# Patient Record
Sex: Male | Born: 1960 | Race: White | Hispanic: No | Smoking: Current every day smoker
Health system: Southern US, Community
[De-identification: ages and names within clinical notes are randomized; demographics above are authoritative.]

## PROBLEM LIST (undated history)

## (undated) DIAGNOSIS — N200 Calculus of kidney: Secondary | ICD-10-CM

## (undated) DIAGNOSIS — F1910 Other psychoactive substance abuse, uncomplicated: Secondary | ICD-10-CM

## (undated) HISTORY — PX: NECK SURGERY: SHX720

## (undated) HISTORY — PX: HERNIA REPAIR: SHX51

---

## 2005-12-01 ENCOUNTER — Other Ambulatory Visit: Payer: Self-pay

## 2005-12-01 ENCOUNTER — Emergency Department: Payer: Self-pay | Admitting: Emergency Medicine

## 2005-12-29 ENCOUNTER — Other Ambulatory Visit: Payer: Self-pay

## 2005-12-29 ENCOUNTER — Emergency Department: Payer: Self-pay | Admitting: Internal Medicine

## 2006-01-03 ENCOUNTER — Ambulatory Visit: Payer: Self-pay | Admitting: Internal Medicine

## 2006-03-09 ENCOUNTER — Emergency Department: Payer: Self-pay | Admitting: Emergency Medicine

## 2006-04-05 ENCOUNTER — Emergency Department: Payer: Self-pay | Admitting: Unknown Physician Specialty

## 2006-04-06 ENCOUNTER — Emergency Department: Payer: Self-pay | Admitting: Emergency Medicine

## 2006-04-16 ENCOUNTER — Emergency Department: Payer: Self-pay | Admitting: Emergency Medicine

## 2006-04-18 ENCOUNTER — Emergency Department: Payer: Self-pay | Admitting: Emergency Medicine

## 2006-04-24 ENCOUNTER — Emergency Department: Payer: Self-pay | Admitting: Emergency Medicine

## 2006-04-25 ENCOUNTER — Ambulatory Visit: Payer: Self-pay | Admitting: Urology

## 2006-04-30 ENCOUNTER — Inpatient Hospital Stay: Payer: Self-pay | Admitting: Urology

## 2006-05-29 ENCOUNTER — Emergency Department: Payer: Self-pay | Admitting: Emergency Medicine

## 2007-06-08 ENCOUNTER — Emergency Department: Payer: Self-pay | Admitting: Emergency Medicine

## 2007-11-05 ENCOUNTER — Ambulatory Visit: Payer: Self-pay | Admitting: Internal Medicine

## 2007-12-21 ENCOUNTER — Ambulatory Visit: Payer: Self-pay | Admitting: Family Medicine

## 2007-12-21 ENCOUNTER — Emergency Department: Payer: Self-pay | Admitting: Emergency Medicine

## 2009-08-02 IMAGING — CT CT STONE STUDY
1 of 2 series · 16 of 32 positions shown, 20 images · non-contrast
Comparison: none

REASON FOR EXAM: flank pain
COMMENTS:

[Series 2: stone · axial · 0.63mm/px · z∈[-628,-238]mm · 16 of 147 slices shown, 20 images]
[im 11/147  soft-tissue]
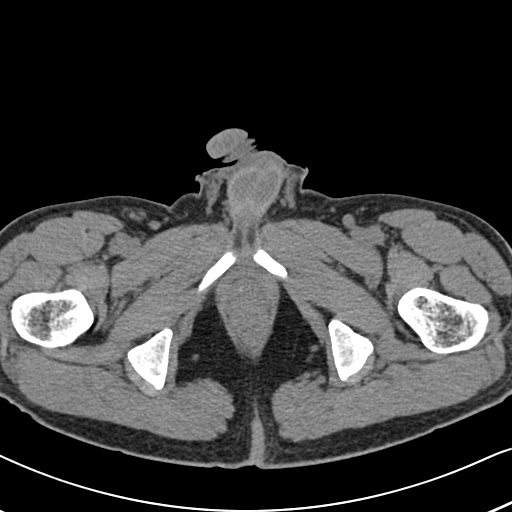
[im 11/147  bone]
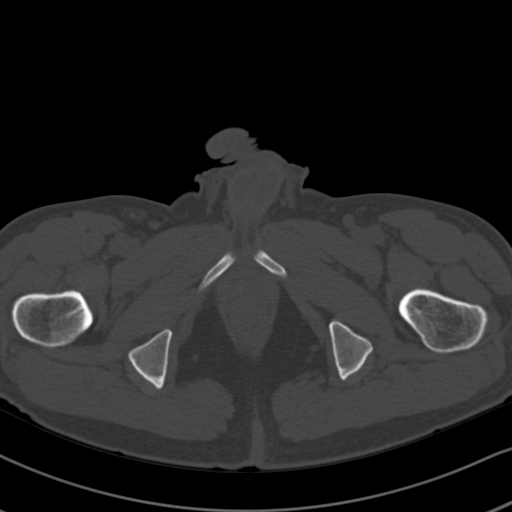
[im 21/147  soft-tissue]
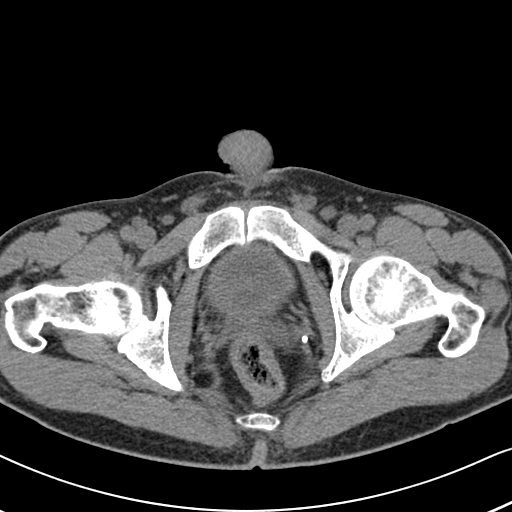
[im 31/147  soft-tissue]
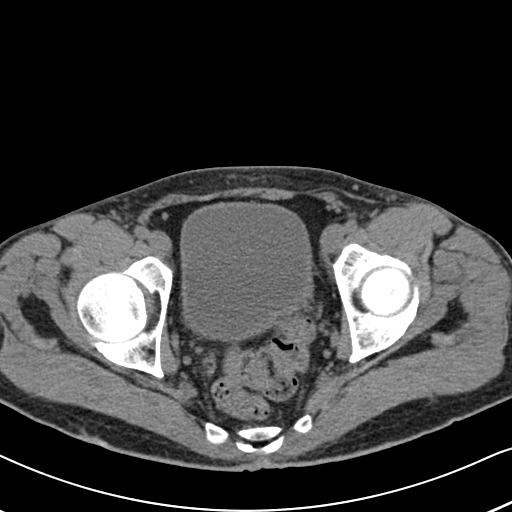
[im 41/147  soft-tissue]
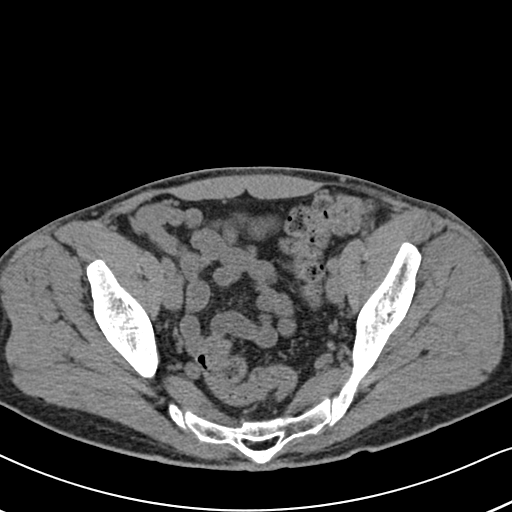
[im 51/147  soft-tissue]
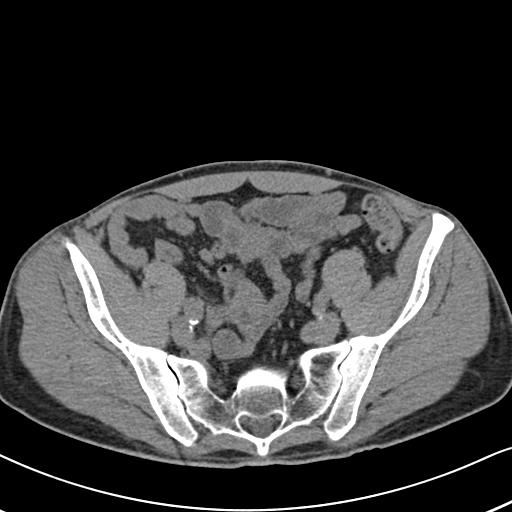
[im 61/147  soft-tissue]
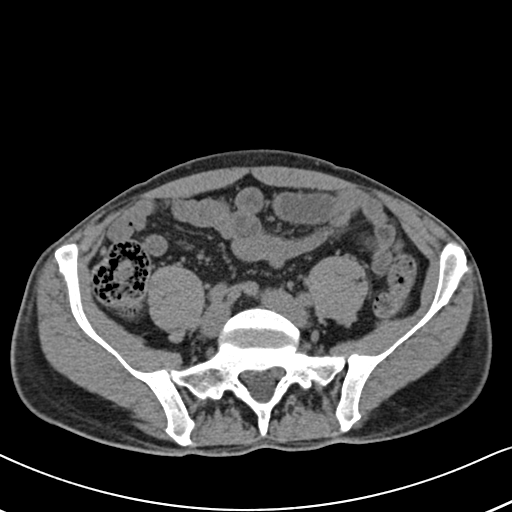
[im 71/147  soft-tissue]
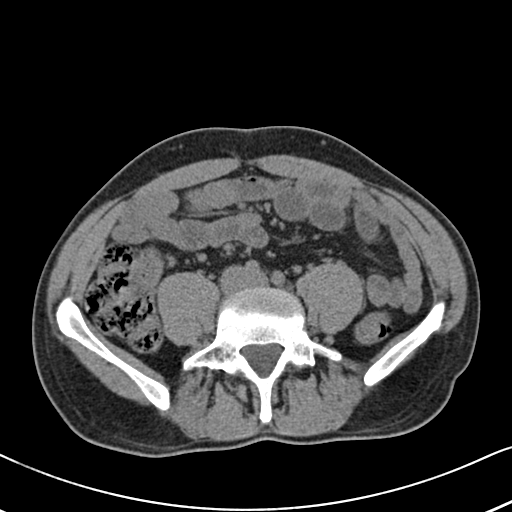
[im 81/147  soft-tissue]
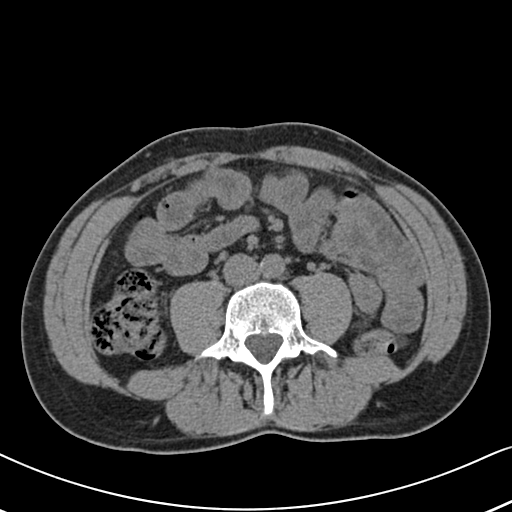
[im 91/147  soft-tissue]
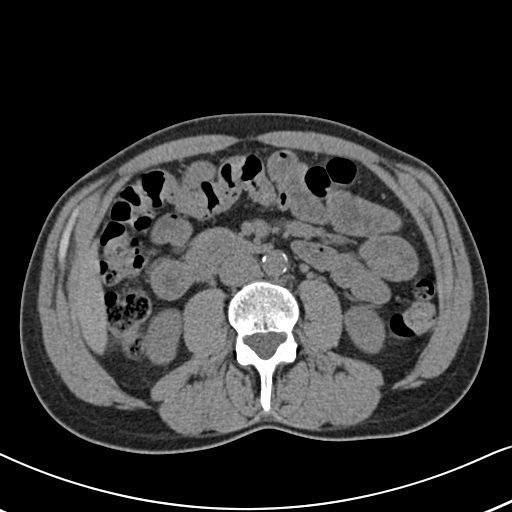
[im 91/147  bone]
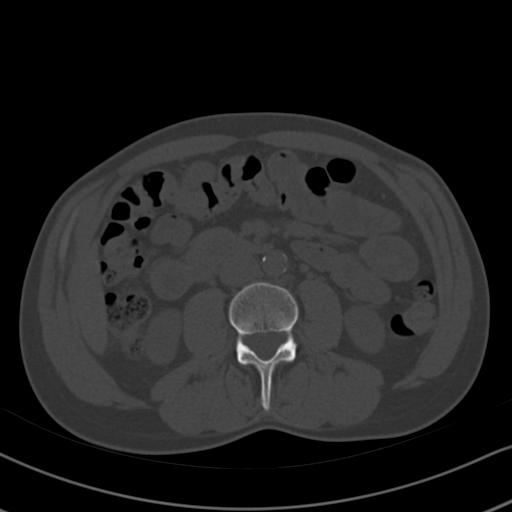
[im 101/147  soft-tissue]
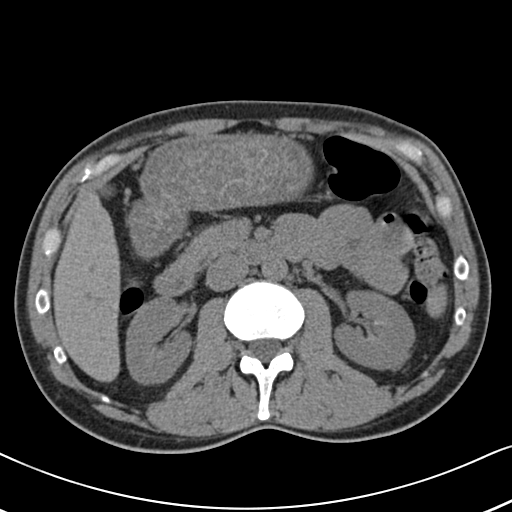
[im 111/147  soft-tissue]
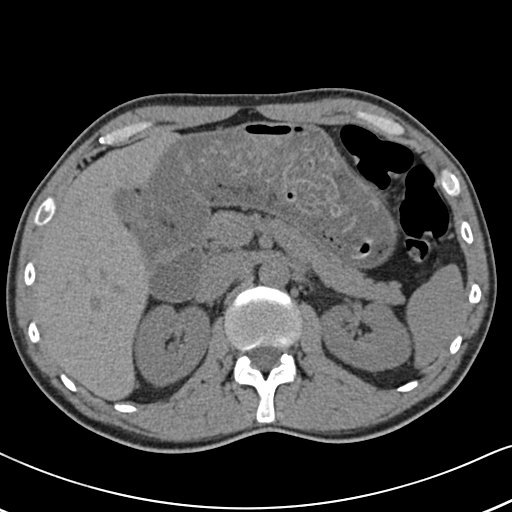
[im 121/147  soft-tissue]
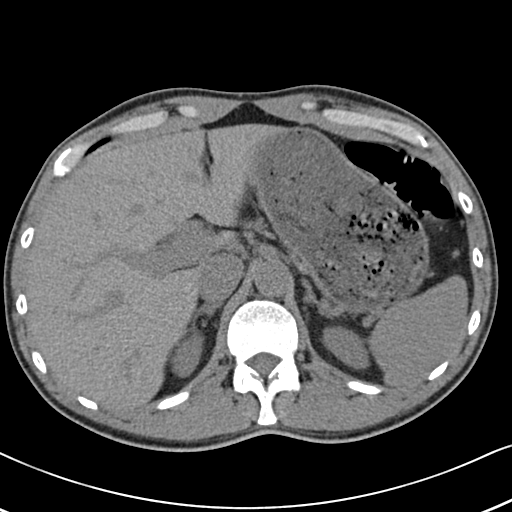
[im 126/147  lung]
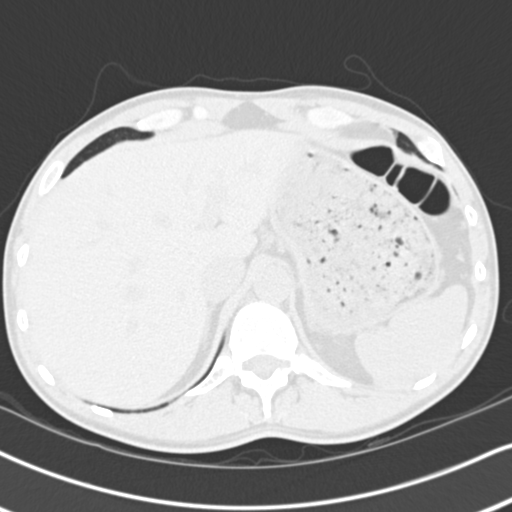
[im 131/147  soft-tissue]
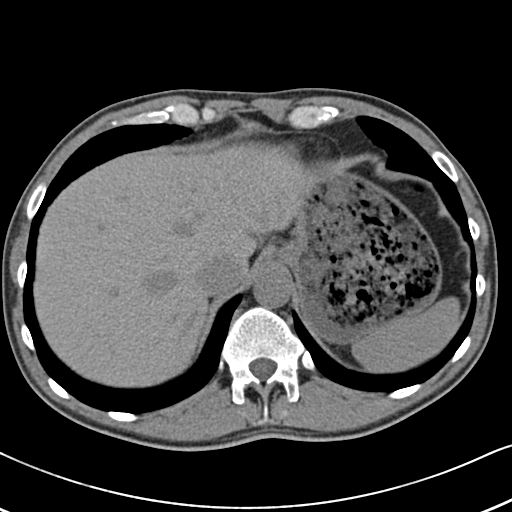
[im 131/147  lung]
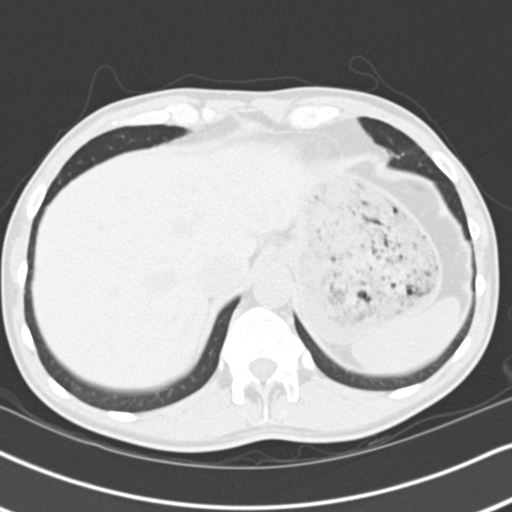
[im 136/147  lung]
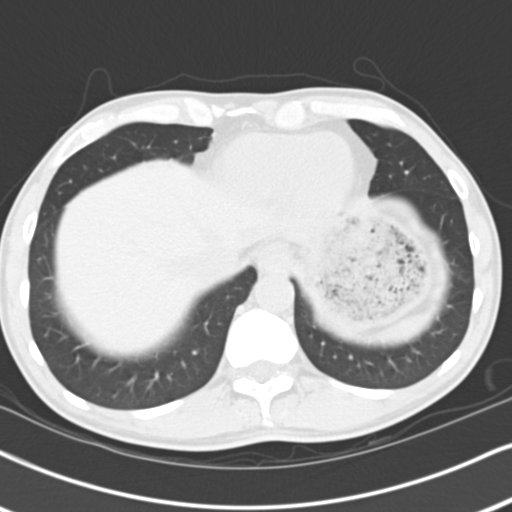
[im 141/147  soft-tissue]
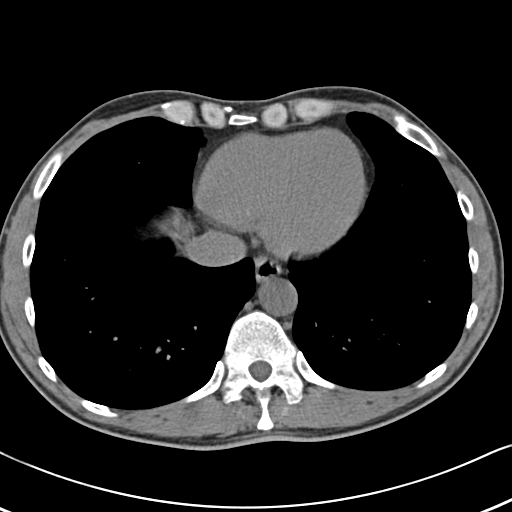
[im 141/147  lung]
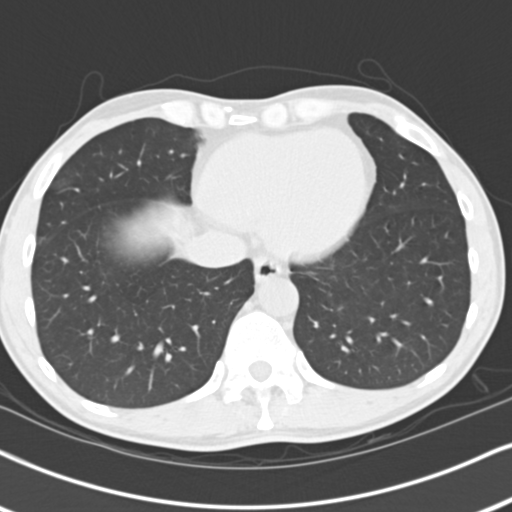

[16 of 32 positions shown; findings below may reference images not displayed]

PROCEDURE:     CT  - CT ABDOMEN /PELVIS WO (STONE)  - December 21, 2007  [DATE]

RESULT:     Noncontrast emergent CT of the abdomen and pelvis is performed.
The patient's previous exams cannot be viewed at this time because of a
registration merge issue.

As noted on previous images, there are nonobstructing bilateral renal
calculi. The stomach is moderately distended and filled with fluid and solid
material. No radiopaque gallstones are evident. The aorta is normal in
caliber. There is some scattered atherosclerotic calcification. The
remaining portion of the bowel appears unremarkable. The appendix is normal.
There is no free fluid or abnormal fluid collection. The urinary bladder
contains no stones and appears unremarkable. The lung bases appear clear.
IMPRESSION: Bilateral renal calculi. No obstructive change evident. No
ureteral stones can be definitely identified.

## 2019-08-31 ENCOUNTER — Encounter: Payer: Self-pay | Admitting: Emergency Medicine

## 2019-08-31 ENCOUNTER — Emergency Department
Admission: EM | Admit: 2019-08-31 | Discharge: 2019-08-31 | Disposition: A | Discharge status: Home / Self Care | Payer: Self-pay | Attending: Emergency Medicine | Admitting: Emergency Medicine

## 2019-08-31 ENCOUNTER — Other Ambulatory Visit: Payer: Self-pay

## 2019-08-31 DIAGNOSIS — Z5320 Procedure and treatment not carried out because of patient's decision for unspecified reasons: Secondary | ICD-10-CM | POA: Insufficient documentation

## 2019-08-31 DIAGNOSIS — Y92410 Unspecified street and highway as the place of occurrence of the external cause: Secondary | ICD-10-CM | POA: Insufficient documentation

## 2019-08-31 DIAGNOSIS — Y9389 Activity, other specified: Secondary | ICD-10-CM | POA: Insufficient documentation

## 2019-08-31 DIAGNOSIS — Y999 Unspecified external cause status: Secondary | ICD-10-CM | POA: Insufficient documentation

## 2019-08-31 DIAGNOSIS — J3489 Other specified disorders of nose and nasal sinuses: Secondary | ICD-10-CM | POA: Insufficient documentation

## 2019-08-31 NOTE — ED Notes (Signed)
Patient called, no answer.
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
## 2019-08-31 NOTE — ED Notes (Signed)
Called patient to speak to [employer] officer - no response. Patient not seen in lobby, bathroom, or outside.

## 2019-08-31 NOTE — ED Notes (Signed)
Called no answer. Not seen in lobby, bathroom, or outside.  

## 2019-10-11 ENCOUNTER — Emergency Department
Admission: EM | Admit: 2019-10-11 | Discharge: 2019-10-11 | Disposition: A | Discharge status: Home / Self Care | Payer: No Typology Code available for payment source | Attending: Emergency Medicine | Admitting: Emergency Medicine

## 2019-10-11 ENCOUNTER — Emergency Department: Payer: No Typology Code available for payment source

## 2019-10-11 ENCOUNTER — Other Ambulatory Visit: Payer: Self-pay

## 2019-10-11 ENCOUNTER — Encounter: Payer: Self-pay | Admitting: Emergency Medicine

## 2019-10-11 DIAGNOSIS — R197 Diarrhea, unspecified: Secondary | ICD-10-CM | POA: Diagnosis not present

## 2019-10-11 DIAGNOSIS — Z79899 Other long term (current) drug therapy: Secondary | ICD-10-CM | POA: Diagnosis not present

## 2019-10-11 DIAGNOSIS — R1031 Right lower quadrant pain: Secondary | ICD-10-CM | POA: Insufficient documentation

## 2019-10-11 DIAGNOSIS — F17210 Nicotine dependence, cigarettes, uncomplicated: Secondary | ICD-10-CM | POA: Diagnosis not present

## 2019-10-11 DIAGNOSIS — R112 Nausea with vomiting, unspecified: Secondary | ICD-10-CM | POA: Diagnosis not present

## 2019-10-11 DIAGNOSIS — Z20822 Contact with and (suspected) exposure to covid-19: Secondary | ICD-10-CM | POA: Diagnosis not present

## 2019-10-11 HISTORY — DX: Other psychoactive substance abuse, uncomplicated: F19.10

## 2019-10-11 HISTORY — DX: Calculus of kidney: N20.0

## 2019-10-11 LAB — CBC
HCT: 58 % — ABNORMAL HIGH (ref 39.0–52.0)
Hemoglobin: 19.5 g/dL — ABNORMAL HIGH (ref 13.0–17.0)
MCH: 28.4 pg (ref 26.0–34.0)
MCHC: 33.6 g/dL (ref 30.0–36.0)
MCV: 84.5 fL (ref 80.0–100.0)
Platelets: 309 10*3/uL (ref 150–400)
RBC: 6.86 MIL/uL — ABNORMAL HIGH (ref 4.22–5.81)
RDW: 13.6 % (ref 11.5–15.5)
WBC: 15.3 10*3/uL — ABNORMAL HIGH (ref 4.0–10.5)
nRBC: 0 % (ref 0.0–0.2)

## 2019-10-11 LAB — HEPATIC FUNCTION PANEL
ALT: 16 U/L (ref 0–44)
AST: 23 U/L (ref 15–41)
Albumin: 4.4 g/dL (ref 3.5–5.0)
Alkaline Phosphatase: 60 U/L (ref 38–126)
Bilirubin, Direct: 0.1 mg/dL (ref 0.0–0.2)
Indirect Bilirubin: 0.9 mg/dL (ref 0.3–0.9)
Total Bilirubin: 1 mg/dL (ref 0.3–1.2)
Total Protein: 7.5 g/dL (ref 6.5–8.1)

## 2019-10-11 LAB — BASIC METABOLIC PANEL
Anion gap: 13 (ref 5–15)
BUN: 19 mg/dL (ref 6–20)
CO2: 23 mmol/L (ref 22–32)
Calcium: 9 mg/dL (ref 8.9–10.3)
Chloride: 101 mmol/L (ref 98–111)
Creatinine, Ser: 1 mg/dL (ref 0.61–1.24)
GFR calc Af Amer: >60 mL/min (ref >60)
GFR calc non Af Amer: >60 mL/min (ref >60)
Glucose, Bld: 97 mg/dL (ref 70–99)
Potassium: 4 mmol/L (ref 3.5–5.1)
Sodium: 137 mmol/L (ref 135–145)

## 2019-10-11 LAB — RESPIRATORY PANEL BY RT PCR (FLU A&B, COVID)
Influenza A by PCR: NEGATIVE
Influenza B by PCR: NEGATIVE
SARS Coronavirus 2 by RT PCR: NEGATIVE

## 2019-10-11 LAB — LIPASE, BLOOD: Lipase: 21 U/L (ref 11–51)

## 2019-10-11 LAB — TROPONIN I (HIGH SENSITIVITY): Troponin I (High Sensitivity): 3 ng/L (ref <18)

## 2019-10-11 LAB — GLUCOSE, CAPILLARY: Glucose-Capillary: 108 mg/dL — ABNORMAL HIGH (ref 70–99)

## 2019-10-11 MED ORDER — SODIUM CHLORIDE 0.9 % IV BOLUS
1000.0000 mL | Freq: Once | INTRAVENOUS | Status: AC
  Administered 2019-10-11: 1000 mL via INTRAVENOUS

## 2019-10-11 MED ORDER — CIPROFLOXACIN HCL 500 MG PO TABS: 500.0000 mg | ORAL_TABLET | Freq: Two times a day (BID) | ORAL | 0 refills | Status: AC

## 2019-10-11 MED ORDER — METRONIDAZOLE 500 MG PO TABS: 500.0000 mg | ORAL_TABLET | Freq: Two times a day (BID) | ORAL | 0 refills | Status: DC

## 2019-10-11 MED ORDER — ONDANSETRON HCL 4 MG/2ML IJ SOLN
4.0000 mg | Freq: Once | INTRAMUSCULAR | Status: AC
  Administered 2019-10-11: 4 mg via INTRAVENOUS
  Filled 2019-10-11: qty 2

## 2019-10-11 MED ORDER — ONDANSETRON 4 MG PO TBDP: 4.0000 mg | ORAL_TABLET | Freq: Three times a day (TID) | ORAL | 0 refills | Status: DC | PRN

## 2019-10-11 MED ORDER — IOHEXOL 300 MG/ML  SOLN
100.0000 mL | Freq: Once | INTRAMUSCULAR | Status: AC | PRN
  Administered 2019-10-11: 100 mL via INTRAVENOUS

## 2019-10-11 NOTE — ED Provider Notes (Signed)
[facility] Emergency Department Provider Note ____________________________________________   First MD Initiated Contact with Patient 10/11/19 1239     (approximate)  I have reviewed the triage vital signs and the nursing notes.   HISTORY  Chief Complaint Abdominal Pain  HPI Jerry [name] is a 59 y.o. male presents to the emergency department for treatment and evaluation of nausea, vomiting, and diarrhea. He had presented to Urgent Care for evaluation and on exam, had pain in the right lower quadrant. He hadn't noticed pain until she pressed on his abdomen. He was then sent to the ER. No known fever.         Past Medical History:  Diagnosis Date  . Kidney stones   . Substance abuse ([place])     There are no problems to display for this patient.   Past Surgical History:  Procedure Laterality Date  . HERNIA REPAIR    . NECK SURGERY      Prior to Admission medications   Medication Sig Start Date End Date Taking? Authorizing Provider  Buprenorphine HCl-Naloxone HCl (SUBOXONE) 8-2 MG FILM Place under the tongue 3 (three) times daily.   Yes [provider]  ciprofloxacin (CIPRO) 500 MG tablet Take 1 tablet (500 mg total) by mouth 2 (two) times daily for 7 days. 10/11/19 10/18/19  Igor Bishop, Dessa Phi, FNP  metroNIDAZOLE (FLAGYL) 500 MG tablet Take 1 tablet (500 mg total) by mouth 2 (two) times daily. 10/11/19   Jadan Hinojos, Johnette Abraham B, FNP  ondansetron (ZOFRAN-ODT) 4 MG disintegrating tablet Take 1 tablet (4 mg total) by mouth every 8 (eight) hours as needed for nausea or vomiting. 10/11/19   Arnice Vanepps B, FNP    Allergies Buspar [buspirone]  No family history on file.  Social History Social History   Tobacco Use  . Smoking status: Current Every Day Smoker    Packs/day: 1.00    Types: Cigarettes  . Smokeless tobacco: Current User    Types: Chew  Substance Use Topics  . Alcohol use: Not Currently  . Drug use: Not Currently    Review of  Systems  Constitutional: No fever/chills Eyes: No visual changes. ENT: No sore throat. Cardiovascular: Denies chest pain. Respiratory: Denies shortness of breath. Gastrointestinal: Positive for abdominal pain.  Positive for nausea, vomiting, and diarrhea. Genitourinary: Negative for dysuria. Musculoskeletal: Negative for back pain. Skin: Negative for rash. Neurological: Negative for headaches, focal weakness or numbness. ___________________________________________   PHYSICAL EXAM:  VITAL SIGNS: ED Triage Vitals  Enc Vitals Group     BP 10/11/19 1139 (!) 164/98     Pulse Rate 10/11/19 1139 (!) 114     Resp 10/11/19 1139 16     Temp 10/11/19 1139 98.5 F (36.9 C)     Temp Source 10/11/19 1139 Oral     SpO2 10/11/19 1139 99 %     Weight 10/11/19 1140 195 lb (88.5 kg)     Height 10/11/19 1140 6\' 1"  (1.854 m)     Head Circumference --      Peak Flow --      Pain Score 10/11/19 1140 3     Pain Loc --      Pain Edu? --      Excl. in [place]? --     Constitutional: Alert and oriented. Well appearing and in no acute distress. Eyes: Conjunctivae are normal. PERRL. EOMI. Head: Atraumatic. Nose: No congestion/rhinnorhea. Mouth/Throat: Mucous membranes are moist.  Oropharynx non-erythematous. Neck: No stridor.   Hematological/Lymphatic/Immunilogical:  No cervical lymphadenopathy. Cardiovascular: Normal rate, regular rhythm. Grossly normal heart sounds.  Good peripheral circulation. Respiratory: Normal respiratory effort.  No retractions. Lungs CTAB. Gastrointestinal: Soft and mildly tender in the right lower quadrant, but no guarding or rebound. No distention. No abdominal bruits. No CVA tenderness. Genitourinary:  Musculoskeletal: No lower extremity tenderness nor edema.  No joint effusions. Neurologic:  Normal speech and language. No gross focal neurologic deficits are appreciated. No gait instability. Skin:  Skin is warm, dry and intact. No rash noted. Psychiatric: Mood and affect  are normal. Speech and behavior are normal.  ____________________________________________   LABS (all labs ordered are listed, but only abnormal results are displayed)  Labs Reviewed  GLUCOSE, CAPILLARY - Abnormal; Notable for the following components:      Result Value   Glucose-Capillary 108 (*)    All other components within normal limits  CBC - Abnormal; Notable for the following components:   WBC 15.3 (*)    RBC 6.86 (*)    Hemoglobin 19.5 (*)    HCT 58.0 (*)    All other components within normal limits  RESPIRATORY PANEL BY RT PCR (FLU A&B, COVID)  BASIC METABOLIC PANEL  HEPATIC FUNCTION PANEL  LIPASE, BLOOD  TROPONIN I (HIGH SENSITIVITY)   ____________________________________________  EKG  Not indicated. ____________________________________________  RADIOLOGY  ED MD interpretation:    CT abdomen pelvis shows no acute process.  Normal appendix.  Right nephrolithiasis without obstructive uropathy.  Official radiology report(s): No results found.  ____________________________________________   PROCEDURES  Procedure(s) performed (including Critical Care):  Procedures  ____________________________________________   INITIAL IMPRESSION / ASSESSMENT AND PLAN   59 year old male presenting to the emergency department for treatment and evaluation of right lower quadrant pain. See HPI for further details. Plan will be to collect labs and get CT of the abdomen and pelvis. Exam is reassuring.  DIFFERENTIAL DIAGNOSIS  Appendicitis, gastritis, nephrolithiasis, gastroenteritis, colitis, diverticulitis, diverticulosis  ED COURSE  Labs show a leukocytosis of 15.3 otherwise are unremarkable. No Influenza or COVID-19. With history of nausea, vomiting, and diarrhea x 2 weeks will prescribe cipro and flagyl. He already has a GI specialist and was encouraged to call tomorrow to reschedule a colonoscopy. For symptoms that change or worsen, he it to return to the ER if  unable to see primary care or GI. ____________________________________________   FINAL CLINICAL IMPRESSION(S) / ED DIAGNOSES  Final diagnoses:  Right lower quadrant abdominal pain  Nausea vomiting and diarrhea     ED Discharge Orders         Ordered    ciprofloxacin (CIPRO) 500 MG tablet  2 times daily     10/11/19 1726    metroNIDAZOLE (FLAGYL) 500 MG tablet  2 times daily     10/11/19 1726    ondansetron (ZOFRAN-ODT) 4 MG disintegrating tablet  Every 8 hours PRN     10/11/19 1726           Jerry [name] was evaluated in Emergency Department on 10/12/2019 for the symptoms described in the history of present illness. He was evaluated in the context of the global COVID-19 pandemic, which necessitated consideration that the patient might be at risk for infection with the SARS-CoV-2 virus that causes COVID-19. Institutional protocols and algorithms that pertain to the evaluation of patients at risk for COVID-19 are in a state of rapid change based on information released by regulatory bodies including the CDC and federal and state organizations. These policies and algorithms  were followed during the patient's care in the ED.   Note:  This document was prepared using Dragon voice recognition software and may include unintentional dictation errors.   Chinita Pester, FNP 10/12/19 1607    Sharyn Creamer, MD 10/12/19 2014

## 2019-10-11 NOTE — ED Triage Notes (Signed)
Pt to ED via POV c/o RLQ abd pain and N/V/D x 3 days. Pt states that he has had nausea x 2 weeks. Pt was scheduled for colonoscopy tomorrow but has not been able to do his prep due to being sick.

## 2019-10-11 NOTE — ED Triage Notes (Signed)
FIRST NURSE NOTE- sent from urgent care for r/o appendicitis.

## 2019-10-11 NOTE — Discharge Instructions (Signed)
No indication of appendicitis or other findings of concern in your abdomen.  Your COVID-19 and influenza screenings are both negative.  Please call your gastroenterologist and reschedule your colonoscopy.  Take the antibiotics as prescribed and until finished.  You have also been given a prescription for Zofran which should help with your nausea.  Take it about 15 minutes prior to eating.  Please start with bland foods and progress to your normal diet.

## 2024-08-01 ENCOUNTER — Other Ambulatory Visit: Payer: Self-pay

## 2024-08-01 ENCOUNTER — Emergency Department

## 2024-08-01 ENCOUNTER — Ambulatory Visit: Admission: EM | Admit: 2024-08-01 | Discharge: 2024-08-02 | Disposition: A | Discharge status: Home / Self Care

## 2024-08-01 DIAGNOSIS — F17210 Nicotine dependence, cigarettes, uncomplicated: Secondary | ICD-10-CM | POA: Insufficient documentation

## 2024-08-01 DIAGNOSIS — K353 Acute appendicitis with localized peritonitis, without perforation or gangrene: Principal | ICD-10-CM

## 2024-08-01 DIAGNOSIS — D7389 Other diseases of spleen: Secondary | ICD-10-CM

## 2024-08-01 DIAGNOSIS — K3580 Unspecified acute appendicitis: Secondary | ICD-10-CM | POA: Diagnosis present

## 2024-08-01 DIAGNOSIS — R1031 Right lower quadrant pain: Secondary | ICD-10-CM

## 2024-08-01 DIAGNOSIS — F1129 Opioid dependence with unspecified opioid-induced disorder: Secondary | ICD-10-CM

## 2024-08-01 LAB — COMPREHENSIVE METABOLIC PANEL WITH GFR
ALT: 22 U/L (ref 0–44)
AST: 26 U/L (ref 15–41)
Albumin: 4.7 g/dL (ref 3.5–5.0)
Alkaline Phosphatase: 86 U/L (ref 38–126)
Anion gap: 16 — ABNORMAL HIGH (ref 5–15)
BUN: 14 mg/dL (ref 8–23)
CO2: 21 mmol/L — ABNORMAL LOW (ref 22–32)
Calcium: 9.8 mg/dL (ref 8.9–10.3)
Chloride: 98 mmol/L (ref 98–111)
Creatinine, Ser: 1.16 mg/dL (ref 0.61–1.24)
GFR, Estimated: >60 mL/min (ref >60)
Glucose, Bld: 124 mg/dL — ABNORMAL HIGH (ref 70–99)
Potassium: 3.6 mmol/L (ref 3.5–5.1)
Sodium: 135 mmol/L (ref 135–145)
Total Bilirubin: 0.9 mg/dL (ref 0.0–1.2)
Total Protein: 7.7 g/dL (ref 6.5–8.1)

## 2024-08-01 LAB — URINALYSIS, ROUTINE W REFLEX MICROSCOPIC
Bilirubin Urine: NEGATIVE
Glucose, UA: NEGATIVE mg/dL
Hgb urine dipstick: NEGATIVE
Ketones, ur: 20 mg/dL — AB
Leukocytes,Ua: NEGATIVE
Nitrite: NEGATIVE
Protein, ur: NEGATIVE mg/dL
Specific Gravity, Urine: >1.046 — ABNORMAL HIGH (ref 1.005–1.030)
pH: 7 (ref 5.0–8.0)

## 2024-08-01 LAB — CBC
HCT: 56.4 % — ABNORMAL HIGH (ref 39.0–52.0)
Hemoglobin: 19.4 g/dL — ABNORMAL HIGH (ref 13.0–17.0)
MCH: 29.7 pg (ref 26.0–34.0)
MCHC: 34.4 g/dL (ref 30.0–36.0)
MCV: 86.4 fL (ref 80.0–100.0)
Platelets: 239 K/uL (ref 150–400)
RBC: 6.53 MIL/uL — ABNORMAL HIGH (ref 4.22–5.81)
RDW: 14.8 % (ref 11.5–15.5)
WBC: 13.4 K/uL — ABNORMAL HIGH (ref 4.0–10.5)
nRBC: 0 % (ref 0.0–0.2)

## 2024-08-01 LAB — LACTIC ACID, PLASMA: Lactic Acid, Venous: 1.5 mmol/L (ref 0.5–1.9)

## 2024-08-01 LAB — LIPASE, BLOOD: Lipase: 26 U/L (ref 11–51)

## 2024-08-01 MED ORDER — IOHEXOL 300 MG/ML  SOLN
100.0000 mL | Freq: Once | INTRAMUSCULAR | Status: AC | PRN
  Administered 2024-08-01: 100 mL via INTRAVENOUS

## 2024-08-01 MED ORDER — SODIUM CHLORIDE 0.9 % IV BOLUS
1000.0000 mL | Freq: Once | INTRAVENOUS | Status: AC
  Administered 2024-08-01: 1000 mL via INTRAVENOUS

## 2024-08-01 MED ORDER — HYDROMORPHONE HCL 1 MG/ML IJ SOLN
1.0000 mg | Freq: Once | INTRAMUSCULAR | Status: AC
  Administered 2024-08-01: 1 mg via INTRAVENOUS
  Filled 2024-08-01: qty 1

## 2024-08-01 MED ORDER — HYDROMORPHONE HCL 1 MG/ML IJ SOLN
0.5000 mg | Freq: Once | INTRAMUSCULAR | Status: AC
  Administered 2024-08-01: 0.5 mg via INTRAVENOUS
  Filled 2024-08-01: qty 0.5

## 2024-08-01 MED ORDER — PIPERACILLIN-TAZOBACTAM 3.375 G IVPB 30 MIN
3.3750 g | Freq: Once | INTRAVENOUS | Status: AC
  Administered 2024-08-01: 3.375 g via INTRAVENOUS
  Filled 2024-08-01: qty 50

## 2024-08-01 MED ORDER — ONDANSETRON HCL 4 MG/2ML IJ SOLN
4.0000 mg | Freq: Once | INTRAMUSCULAR | Status: AC
  Administered 2024-08-01: 4 mg via INTRAVENOUS
  Filled 2024-08-01: qty 2

## 2024-08-01 NOTE — ED Provider Notes (Signed)
 [facility] Provider Note    Event Date/Time   First MD Initiated Contact with Patient 08/01/24 2111     (approximate)   History   Abdominal Pain   HPI  Jerry [name] is a 63 y.o. male with a past medical history of opioid dependence, previous right inguinal repair, THC use who presents with sudden onset 1 day of right lower abdominal pain.  Pain is constant and crampy.  He does have a history of kidney stones and this feels different.  He denies any changes in his urinary habits but did have diarrhea this morning.  There is nausea but he has not had any emesis.  He denies any changes in his scrotum or penis.  His mother and father was at bedside who helped contribute to the history      Physical Exam   Triage Vital Signs: ED Triage Vitals  Encounter Vitals Group     BP 08/01/24 2048 (!) 173/84     Girls Systolic BP Percentile --      Girls Diastolic BP Percentile --      Boys Systolic BP Percentile --      Boys Diastolic BP Percentile --      Pulse Rate 08/01/24 2048 100     Resp 08/01/24 2048 (!) 21     Temp 08/01/24 2048 98.2 F (36.8 C)     Temp src --      SpO2 08/01/24 2048 99 %     Weight --      Height --      Head Circumference --      Peak Flow --      Pain Score 08/01/24 2043 8     Pain Loc --      Pain Education --      Exclude from Growth Chart --     Most recent vital signs: Vitals:   08/01/24 2048  BP: (!) 173/84  Pulse: 100  Resp: (!) 21  Temp: 98.2 F (36.8 C)  SpO2: 99%    Nursing Triage Note reviewed. Vital signs reviewed and patients oxygen saturation is normoxic  General: Patient is well nourished, well developed, awake and alert, appears uncomfortable Head: Normocephalic and atraumatic Eyes: Normal inspection, extraocular muscles intact, no conjunctival pallor Ear, nose, throat: Normal external exam Neck: Normal range of motion Respiratory: Patient is in no respiratory distress, lungs  CTAB Cardiovascular: Patient is not tachycardic, RRR without murmur appreciated GI: Abd soft, tender to palpation in the right lower quadrant with guarding but no rebound Back: Normal inspection of the back with good strength and range of motion throughout all ext Extremities: pulses intact with good cap refills, no LE pitting edema or calf tenderness Neuro: The patient is alert and oriented to person, place, and time, appropriately conversive, with 5/5 bilat UE/LE strength, no gross motor or sensory defects noted. Coordination appears to be adequate. Skin: Warm, dry, and intact Psych: normal mood and affect, no SI or HI  ED Results / Procedures / Treatments   Labs (all labs ordered are listed, but only abnormal results are displayed) Labs Reviewed  COMPREHENSIVE METABOLIC PANEL WITH GFR - Abnormal; Notable for the following components:      Result Value   CO2 21 (*)    Glucose, Bld 124 (*)    Anion gap 16 (*)    All other components within normal limits  CBC - Abnormal; Notable for the following components:   WBC 13.4 (*)  RBC 6.53 (*)    Hemoglobin 19.4 (*)    HCT 56.4 (*)    All other components within normal limits  URINALYSIS, ROUTINE W REFLEX MICROSCOPIC - Abnormal; Notable for the following components:   Color, Urine YELLOW (*)    APPearance CLEAR (*)    Specific Gravity, Urine >1.046 (*)    Ketones, ur 20 (*)    All other components within normal limits  LIPASE, BLOOD  LACTIC ACID, PLASMA  URINE DRUG SCREEN     EKG EKG and rhythm strip are interpreted by myself:   EKG: [Normal sinus rhythm] at heart rate of 80, normal QRS duration, QTc 448, normal ST segments and T waves no ectopy EKG not consistent with Acute STEMI Rhythm strip: NSR in lead [name]   RADIOLOGY CT abd and pelvis: Demonstrates acute appendicitis on my independent review interpretation radiologist agrees and adds that there were splenic lesions that may be consistent with  malignancy    PROCEDURES:  Critical Care performed: No  Procedures   MEDICATIONS ORDERED IN ED: Medications  piperacillin -tazobactam (ZOSYN ) IVPB 3.375 g (0 g Intravenous Stopped 08/01/24 2218)  sodium chloride  0.9 % bolus 1,000 mL (1,000 mLs Intravenous New Bag/Given 08/01/24 2143)  HYDROmorphone  (DILAUDID ) injection 0.5 mg (0.5 mg Intravenous Given 08/01/24 2144)  ondansetron  (ZOFRAN ) injection 4 mg (4 mg Intravenous Given 08/01/24 2143)  iohexol  (OMNIPAQUE ) 300 MG/ML solution 100 mL (100 mLs Intravenous Contrast Given 08/01/24 2156)  HYDROmorphone  (DILAUDID ) injection 1 mg (1 mg Intravenous Given 08/01/24 2218)     IMPRESSION / MDM / ASSESSMENT AND PLAN / ED COURSE                                Differential diagnosis includes, but is not limited to acute appendicitis, small bowel obstruction, bowel ischemia, colitis, electrolyte derangement   ED course: Patient presents and appears highly uncomfortable with pain out of proportion to exam.  Lactic acid was ordered for potential mesenteric ischemia which was not elevated.  He was given several doses of Dilaudid  and covered with Zosyn  for intra-abdominal pathologies along with IV fluid.  He did have a leukocytosis but no profound electrolyte derangements.  CT abdomen pelvis was consistent with acute appendicitis with also possible splenic lesions of unclear cause.  General surgery consultant at bedside who will admit the patient for possible intervention.  Patient updated and agreeable with plan   Clinical Course as of 08/02/24 0002  Sat Aug 01, 2024  2205 Patient returning from CT scanner endorsing continued pain 10 out of 10 will order him 1 mg of Dilaudid  [HD]  2240 Consistent with appendicitis will page general surgery [HD]  2242 Case discussed with general surgery [HD]    Clinical Course User Index [HD] Nicholaus Rolland BRAVO, MD   -- Risk: 5 This patient has a high risk of morbidity due to further diagnostic testing or  treatment. Rationale: This patient's evaluation and management involve a high risk of morbidity due to the potential severity of presenting symptoms, need for diagnostic testing, and/or initiation of treatment that may require close monitoring. The differential includes conditions with potential for significant deterioration or requiring escalation of care. Treatment decisions in the ED, including medication administration, procedural interventions, or disposition planning, reflect this level of risk. COPA: 5 The patient has the following acute or chronic illness/injury that poses a possible threat to life or bodily function: [X] : The patient has a potentially serious  acute condition or an acute exacerbation of a chronic illness requiring urgent evaluation and management in the Emergency Department. The clinical presentation necessitates immediate consideration of life-threatening or function-threatening diagnoses, even if they are ultimately ruled out.   FINAL CLINICAL IMPRESSION(S) / ED DIAGNOSES   Final diagnoses:  Acute appendicitis with localized peritonitis, without perforation or gangrene, unspecified whether abscess present  Splenic lesion  Right lower quadrant abdominal pain  Opioid dependence with opioid-induced disorder (HCC)     Rx / DC Orders   ED Discharge Orders     None        Note:  This document was prepared using Dragon voice recognition software and may include unintentional dictation errors.   Nicholaus Rolland BRAVO, MD 08/02/24 [place]

## 2024-08-01 NOTE — H&P (Signed)
 Subjective:   CC: Acute appendicitis  HPI:  Jerry [name] is a 63 y.o. male who is consulted by [facility] for evaluation of  above cc.  Symptoms were first noted 3 days ago. Pain is sharp, right lower quadrant radiating straight through the back associated with decreased appetite, exacerbated by nothing specific     Past Medical History:  has a past medical history of Kidney stones and Substance abuse (HCC).  Past Surgical History:  has a past surgical history that includes Hernia repair and Neck surgery.  Family History: family history is not on file.  Social History:  reports that he has been smoking cigarettes. His smokeless tobacco use includes chew. He reports that he does not currently use alcohol. He reports that he does not currently use drugs.  Current Medications:  Prior to Admission medications   Medication Sig Start Date End Date Taking? Authorizing Provider  Buprenorphine  HCl-Naloxone  HCl (SUBOXONE ) 8-2 MG FILM Place under the tongue 3 (three) times daily.    [provider]  ondansetron  (ZOFRAN -ODT) 4 MG disintegrating tablet Take 1 tablet (4 mg total) by mouth every 8 (eight) hours as needed for nausea or vomiting. 10/11/19   Herlinda Belton B, FNP    Allergies:  Allergies as of 08/01/2024 - Reviewed 08/01/2024  Allergen Reaction Noted   Buspar [buspirone] Hives and Rash 08/31/2019    ROS:  Pertinent negative and positives noted in HPI   Objective:     BP (!) 173/84 (BP Location: Right Arm)   Pulse 100   Temp 98.2 F (36.8 C)   Resp (!) 21   SpO2 99%    Constitutional :  alert, cooperative, appears stated age, and no distress  Respiratory:  Clear to auscultation bilaterally  Cardiovascular:  Regular rate and rhythm  Gastrointestinal: Soft, no guarding, focal tenderness to palpation right lower quadrant.   Skin: Cool and moist  Psychiatric: Normal affect, non-agitated, not confused       LABS:     Latest Ref Rng & Units 08/01/2024    8:50 PM  10/11/2019    1:18 PM  CMP  Glucose 70 - 99 mg/dL 875  97   BUN 8 - 23 mg/dL 14  19   Creatinine 9.38 - 1.24 mg/dL 8.83  8.99   Sodium 864 - 145 mmol/L 135  137   Potassium 3.5 - 5.1 mmol/L 3.6  4.0   Chloride 98 - 111 mmol/L 98  101   CO2 22 - 32 mmol/L 21  23   Calcium 8.9 - 10.3 mg/dL 9.8  9.0   Total Protein 6.5 - 8.1 g/dL 7.7  7.5   Total Bilirubin 0.0 - 1.2 mg/dL 0.9  1.0   Alkaline Phos 38 - 126 U/L 86  60   AST 15 - 41 U/L 26  23   ALT 0 - 44 U/L 22  16       Latest Ref Rng & Units 08/01/2024    8:50 PM 10/11/2019   11:58 AM  CBC  WBC 4.0 - 10.5 K/uL 13.4  15.3   Hemoglobin 13.0 - 17.0 g/dL 80.5  80.4   Hematocrit 39.0 - 52.0 % 56.4  58.0   Platelets 150 - 400 K/uL 239  309      RADS: CLINICAL DATA:  Right lower quadrant abdominal pain for 3 days   EXAM: CT ABDOMEN AND PELVIS WITH CONTRAST   TECHNIQUE: Multidetector CT imaging of the abdomen and pelvis was performed using the  standard protocol following bolus administration of intravenous contrast.   RADIATION DOSE REDUCTION: This exam was performed according to the departmental dose-optimization program which includes automated exposure control, adjustment of the mA and/or kV according to patient size and/or use of iterative reconstruction technique.   CONTRAST:  OMNIPAQUE  IOHEXOL  300 MG/ML  SOLN   COMPARISON:  10/11/2019   FINDINGS: Lower chest: No acute pleural or parenchymal lung disease.   Hepatobiliary: No focal liver abnormality is seen. No gallstones, gallbladder wall thickening, or biliary dilatation.   Pancreas: Unremarkable. No pancreatic ductal dilatation or surrounding inflammatory changes.   Spleen: There are 2 ill-defined masses within the spleen, with an inferior exophytic mass measuring 5.2 x 3.8 x 5.9 cm and an ill-defined central mass measuring 5.1 x 4.8 x 5.3 cm. No evidence of splenomegaly.   Adrenals/Urinary Tract: There are 3 distinct sub 3 mm nonobstructing right  renal calculi. No left-sided calculi. No obstructive uropathy within either kidney. The adrenals and bladder appear unremarkable.   Stomach/Bowel: No bowel obstruction or ileus. Scattered colonic diverticulosis without diverticulitis. The proximal appendix is dilated measuring 9 mm in diameter, with mild periappendiceal fat stranding and central appendicoliths. The distal appendix is decompressed. Acute appendicitis. No perforation, fluid collection, or abscess.   Vascular/Lymphatic: Aortic atherosclerosis. No enlarged abdominal or pelvic lymph nodes.   Reproductive: Prostate is unremarkable.   Other: No free fluid or free intraperitoneal gas. Small fat containing umbilical hernia. No bowel herniation.   Musculoskeletal: No acute or destructive bony abnormalities. Reconstructed images demonstrate no additional findings.   IMPRESSION: 1. Findings consistent with acute uncomplicated appendicitis. No perforation, fluid collection, or abscess. 2. New and enlarging ill-defined splenic masses as above. Differential diagnosis includes both benign and malignant etiologies, and further characterization with nonemergent outpatient splenic MRI with and without contrast is recommended. 3. Multiple nonobstructing right renal calculi. 4.  Aortic Atherosclerosis (ICD10-I70.0).     Electronically Signed   By: Ozell Daring M.D.   On: 08/01/2024 22:17 Assessment:      Acute appendicitis with leukocytosis and pain location consistent although the overall level of inflammation seems to be minimal for 3-day history of pain.  Plan:      Discussed the risk of surgery including post-op infxn, seroma, hematoma, abscess formation, chronic pain, poor-delayed wound healing, possible bowel resection, possible ostomy, possible conversion to open procedure, post-op SBO or ileus, and need for additional procedures to address said risks.  The risks of general anesthetic including MI, CVA, sudden death  or even reaction to anesthetic medications also discussed. Alternatives include continued observation, or antibiotic treatment.  Benefits include possible symptom relief,   Typical post operative recovery of 3-5 days rest, also discussed.  The patient understands the risks, any and all questions were answered to the patient's satisfaction.  2 OR for robotic assisted laparoscopic appendectomy.  N.p.o., IV fluids, antibiotics in the meantime.  Will continue home Suboxone   labs/images/medications/previous chart entries reviewed personally and relevant changes/updates noted above.

## 2024-08-01 NOTE — ED Notes (Signed)
 Pt sts inability to provide sample at this time. Pt provided with urine specimen cup for pending UA. Wheelchair to lobby. Has personal cane with pt. Pt with fall risk bracelet. Offered nonslip socks x 2 to which pt refused. Pt has mother with him in lobby. Asked to request assistance as needed.

## 2024-08-01 NOTE — ED Triage Notes (Signed)
 Pt BIB [facility] from home d/t concerns for RLQ abd pain x3 days - worsening tonight. Pain worsening with movement. Associated with decreased PO intake, NVD. This pain does not feel typical of his kidney stone pain.  Per note pt currently on Zio patch for syncope workup with the [place].    EMS VS 150/90, HR 102, 98% RA, T 97.8

## 2024-08-02 ENCOUNTER — Observation Stay: Admitting: Certified Registered"

## 2024-08-02 ENCOUNTER — Other Ambulatory Visit: Payer: Self-pay

## 2024-08-02 ENCOUNTER — Encounter: Admission: EM | Disposition: A | Payer: Self-pay | Source: Home / Self Care

## 2024-08-02 HISTORY — PX: XI ROBOTIC LAPAROSCOPIC ASSISTED APPENDECTOMY: SHX6877

## 2024-08-02 LAB — BASIC METABOLIC PANEL WITH GFR
Anion gap: 10 (ref 5–15)
BUN: 11 mg/dL (ref 8–23)
CO2: 25 mmol/L (ref 22–32)
Calcium: 8.7 mg/dL — ABNORMAL LOW (ref 8.9–10.3)
Chloride: 102 mmol/L (ref 98–111)
Creatinine, Ser: 0.98 mg/dL (ref 0.61–1.24)
GFR, Estimated: >60 mL/min (ref >60)
Glucose, Bld: 95 mg/dL (ref 70–99)
Potassium: 4 mmol/L (ref 3.5–5.1)
Sodium: 137 mmol/L (ref 135–145)

## 2024-08-02 LAB — CBC
HCT: 50 % (ref 39.0–52.0)
Hemoglobin: 17.2 g/dL — ABNORMAL HIGH (ref 13.0–17.0)
MCH: 30.1 pg (ref 26.0–34.0)
MCHC: 34.4 g/dL (ref 30.0–36.0)
MCV: 87.4 fL (ref 80.0–100.0)
Platelets: 189 K/uL (ref 150–400)
RBC: 5.72 MIL/uL (ref 4.22–5.81)
RDW: 14.7 % (ref 11.5–15.5)
WBC: 10.6 K/uL — ABNORMAL HIGH (ref 4.0–10.5)
nRBC: 0 % (ref 0.0–0.2)

## 2024-08-02 LAB — URINE DRUG SCREEN
Amphetamines: NEGATIVE
Barbiturates: NEGATIVE
Benzodiazepines: NEGATIVE
Cocaine: NEGATIVE
Fentanyl: NEGATIVE
Methadone Scn, Ur: NEGATIVE
Opiates: NEGATIVE
Tetrahydrocannabinol: POSITIVE — AB

## 2024-08-02 LAB — HIV ANTIBODY (ROUTINE TESTING W REFLEX): HIV Screen 4th Generation wRfx: NONREACTIVE

## 2024-08-02 SURGERY — APPENDECTOMY, ROBOT-ASSISTED, LAPAROSCOPIC
Anesthesia: General

## 2024-08-02 MED ORDER — ACETAMINOPHEN 325 MG PO TABS: 650.0000 mg | ORAL_TABLET | Freq: Three times a day (TID) | ORAL | Status: DC | PRN

## 2024-08-02 MED ORDER — SUGAMMADEX SODIUM 200 MG/2ML IV SOLN
INTRAVENOUS | Status: DC | PRN
  Administered 2024-08-02: 180 mg via INTRAVENOUS

## 2024-08-02 MED ORDER — DOCUSATE SODIUM 100 MG PO CAPS
100.0000 mg | ORAL_CAPSULE | Freq: Two times a day (BID) | ORAL | 0 refills | Status: AC | PRN
  Filled 2024-08-02: qty 20, 10d supply, fill #0

## 2024-08-02 MED ORDER — MIDAZOLAM HCL 2 MG/2ML IJ SOLN
INTRAMUSCULAR | Status: AC
  Filled 2024-08-02: qty 2

## 2024-08-02 MED ORDER — KETOROLAC TROMETHAMINE 30 MG/ML IJ SOLN
INTRAMUSCULAR | Status: AC
  Filled 2024-08-02: qty 1

## 2024-08-02 MED ORDER — DEXMEDETOMIDINE HCL IN NACL 80 MCG/20ML IV SOLN
INTRAVENOUS | Status: DC | PRN
  Administered 2024-08-02 (×2): 8 ug via INTRAVENOUS

## 2024-08-02 MED ORDER — BUPIVACAINE-EPINEPHRINE (PF) 0.5% -1:200000 IJ SOLN
INTRAMUSCULAR | Status: AC
  Filled 2024-08-02: qty 30

## 2024-08-02 MED ORDER — SUCCINYLCHOLINE CHLORIDE 200 MG/10ML IV SOSY
PREFILLED_SYRINGE | INTRAVENOUS | Status: AC
Start: 2024-08-02 — End: 2024-08-02
  Filled 2024-08-02: qty 10

## 2024-08-02 MED ORDER — SODIUM CHLORIDE 0.9 % IV SOLN
2.0000 g | INTRAVENOUS | Status: DC
  Administered 2024-08-02: 2 g via INTRAVENOUS
  Filled 2024-08-02: qty 20

## 2024-08-02 MED ORDER — ACETAMINOPHEN 10 MG/ML IV SOLN
INTRAVENOUS | Status: AC
  Filled 2024-08-02: qty 100

## 2024-08-02 MED ORDER — OXYCODONE-ACETAMINOPHEN 5-325 MG PO TABS
1.0000 | ORAL_TABLET | Freq: Three times a day (TID) | ORAL | 0 refills | Status: AC | PRN
Start: 2024-08-02 — End: 2025-08-02
  Filled 2024-08-02: qty 15, 5d supply, fill #0

## 2024-08-02 MED ORDER — ONDANSETRON HCL 4 MG/2ML IJ SOLN: 4.0000 mg | Freq: Four times a day (QID) | INTRAMUSCULAR | Status: DC | PRN

## 2024-08-02 MED ORDER — LIDOCAINE HCL (PF) 2 % IJ SOLN
INTRAMUSCULAR | Status: AC
Start: 2024-08-02 — End: 2024-08-02
  Filled 2024-08-02: qty 5

## 2024-08-02 MED ORDER — SEVOFLURANE IN SOLN
RESPIRATORY_TRACT | Status: AC
  Filled 2024-08-02: qty 250

## 2024-08-02 MED ORDER — HYDROMORPHONE HCL 1 MG/ML IJ SOLN
INTRAMUSCULAR | Status: AC
  Filled 2024-08-02: qty 1

## 2024-08-02 MED ORDER — DEXAMETHASONE SOD PHOSPHATE PF 10 MG/ML IJ SOLN
INTRAMUSCULAR | Status: DC | PRN
  Administered 2024-08-02: 10 mg via INTRAVENOUS

## 2024-08-02 MED ORDER — METOPROLOL TARTRATE 5 MG/5ML IV SOLN
INTRAVENOUS | Status: DC | PRN
  Administered 2024-08-02: 2 mg via INTRAVENOUS

## 2024-08-02 MED ORDER — GLYCOPYRROLATE 0.2 MG/ML IJ SOLN
INTRAMUSCULAR | Status: AC
  Filled 2024-08-02: qty 1

## 2024-08-02 MED ORDER — ONDANSETRON 4 MG PO TBDP
4.0000 mg | ORAL_TABLET | Freq: Three times a day (TID) | ORAL | 0 refills | Status: AC | PRN
  Filled 2024-08-02: qty 20, 7d supply, fill #0

## 2024-08-02 MED ORDER — BUPRENORPHINE HCL-NALOXONE HCL 8-2 MG SL SUBL: 1.0000 | SUBLINGUAL_TABLET | Freq: Every day | SUBLINGUAL | Status: DC

## 2024-08-02 MED ORDER — ACETAMINOPHEN 325 MG PO TABS
650.0000 mg | ORAL_TABLET | Freq: Three times a day (TID) | ORAL | 0 refills | Status: AC | PRN
  Filled 2024-08-02: qty 40, 7d supply, fill #0

## 2024-08-02 MED ORDER — KETOROLAC TROMETHAMINE 30 MG/ML IJ SOLN
INTRAMUSCULAR | Status: DC | PRN
  Administered 2024-08-02: 30 mg via INTRAVENOUS

## 2024-08-02 MED ORDER — ONDANSETRON HCL 4 MG/2ML IJ SOLN
INTRAMUSCULAR | Status: AC
  Filled 2024-08-02: qty 2

## 2024-08-02 MED ORDER — DOCUSATE SODIUM 100 MG PO CAPS: 100.0000 mg | ORAL_CAPSULE | Freq: Two times a day (BID) | ORAL | Status: DC | PRN

## 2024-08-02 MED ORDER — CELECOXIB 200 MG PO CAPS
200.0000 mg | ORAL_CAPSULE | Freq: Every day | ORAL | 0 refills | Status: AC
  Filled 2024-08-02: qty 10, 10d supply, fill #0

## 2024-08-02 MED ORDER — PROPOFOL 10 MG/ML IV BOLUS
INTRAVENOUS | Status: DC | PRN
  Administered 2024-08-02: 200 mg via INTRAVENOUS

## 2024-08-02 MED ORDER — ROCURONIUM BROMIDE 100 MG/10ML IV SOLN
INTRAVENOUS | Status: DC | PRN
  Administered 2024-08-02: 60 mg via INTRAVENOUS

## 2024-08-02 MED ORDER — FENTANYL CITRATE (PF) 100 MCG/2ML IJ SOLN
INTRAMUSCULAR | Status: DC | PRN
  Administered 2024-08-02 (×2): 50 ug via INTRAVENOUS

## 2024-08-02 MED ORDER — BUPIVACAINE-EPINEPHRINE (PF) 0.5% -1:200000 IJ SOLN
INTRAMUSCULAR | Status: DC | PRN
  Administered 2024-08-02: 14 mL via PERINEURAL
  Administered 2024-08-02: 16 mL via PERINEURAL

## 2024-08-02 MED ORDER — TRAMADOL HCL 50 MG PO TABS: 50.0000 mg | ORAL_TABLET | Freq: Four times a day (QID) | ORAL | Status: DC | PRN

## 2024-08-02 MED ORDER — HYDROMORPHONE HCL 1 MG/ML IJ SOLN
INTRAMUSCULAR | Status: DC | PRN
  Administered 2024-08-02 (×2): .5 mg via INTRAVENOUS

## 2024-08-02 MED ORDER — MORPHINE SULFATE (PF) 2 MG/ML IV SOLN
2.0000 mg | INTRAVENOUS | Status: DC | PRN
  Administered 2024-08-02 (×2): 2 mg via INTRAVENOUS
  Filled 2024-08-02 (×2): qty 1

## 2024-08-02 MED ORDER — ONDANSETRON HCL 4 MG/2ML IJ SOLN
INTRAMUSCULAR | Status: DC | PRN
  Administered 2024-08-02: 4 mg via INTRAVENOUS

## 2024-08-02 MED ORDER — METOPROLOL TARTRATE 5 MG/5ML IV SOLN
INTRAVENOUS | Status: AC
Start: 2024-08-02 — End: 2024-08-02
  Filled 2024-08-02: qty 5

## 2024-08-02 MED ORDER — FENTANYL CITRATE (PF) 100 MCG/2ML IJ SOLN
INTRAMUSCULAR | Status: AC
  Filled 2024-08-02: qty 2

## 2024-08-02 MED ORDER — ROCURONIUM BROMIDE 10 MG/ML (PF) SYRINGE
PREFILLED_SYRINGE | INTRAVENOUS | Status: AC
Start: 2024-08-02 — End: 2024-08-02
  Filled 2024-08-02: qty 10

## 2024-08-02 MED ORDER — HYDROCODONE-ACETAMINOPHEN 5-325 MG PO TABS: 1.0000 | ORAL_TABLET | Freq: Three times a day (TID) | ORAL | Status: DC | PRN

## 2024-08-02 MED ORDER — LIDOCAINE HCL (CARDIAC) PF 100 MG/5ML IV SOSY
PREFILLED_SYRINGE | INTRAVENOUS | Status: DC | PRN
  Administered 2024-08-02: 100 mg via INTRAVENOUS

## 2024-08-02 MED ORDER — GABAPENTIN 100 MG PO CAPS
100.0000 mg | ORAL_CAPSULE | Freq: Three times a day (TID) | ORAL | 0 refills | Status: AC
  Filled 2024-08-02: qty 30, 10d supply, fill #0

## 2024-08-02 MED ORDER — ONDANSETRON 4 MG PO TBDP: 4.0000 mg | ORAL_TABLET | Freq: Four times a day (QID) | ORAL | Status: DC | PRN

## 2024-08-02 MED ORDER — SODIUM CHLORIDE 0.9 % IV SOLN
INTRAVENOUS | Status: DC
Start: 2024-08-02 — End: 2024-08-02

## 2024-08-02 MED ORDER — METRONIDAZOLE 500 MG/100ML IV SOLN
500.0000 mg | Freq: Two times a day (BID) | INTRAVENOUS | Status: DC
  Administered 2024-08-02: 500 mg via INTRAVENOUS
  Filled 2024-08-02: qty 100

## 2024-08-02 MED ORDER — ACETAMINOPHEN 10 MG/ML IV SOLN
INTRAVENOUS | Status: DC | PRN
  Administered 2024-08-02: 1000 mg via INTRAVENOUS

## 2024-08-02 MED ORDER — GLYCOPYRROLATE 0.2 MG/ML IJ SOLN
INTRAMUSCULAR | Status: DC | PRN
  Administered 2024-08-02: .2 mg via INTRAVENOUS

## 2024-08-02 MED ORDER — MIDAZOLAM HCL (PF) 2 MG/2ML IJ SOLN
INTRAMUSCULAR | Status: DC | PRN
  Administered 2024-08-02: 2 mg via INTRAVENOUS

## 2024-08-02 SURGICAL SUPPLY — 34 items
ANCHOR TIS RET SYS 235ML (MISCELLANEOUS) ×1 IMPLANT
CLIP APPLIE XI LRG REUSE DVNC (INSTRUMENTS) IMPLANT
COVER TIP SHEARS 8 DVNC (MISCELLANEOUS) ×1 IMPLANT
COVER WAND RF STERILE (DRAPES) ×1 IMPLANT
DEFOGGER SCOPE WARM SEASHARP (MISCELLANEOUS) IMPLANT
DERMABOND ADVANCED .7 DNX12 (GAUZE/BANDAGES/DRESSINGS) ×1 IMPLANT
DRAPE ARM DVNC X/XI (DISPOSABLE) ×3 IMPLANT
DRAPE COLUMN DVNC XI (DISPOSABLE) ×1 IMPLANT
ELECTRODE REM PT RTRN 9FT ADLT (ELECTROSURGICAL) ×1 IMPLANT
FORCEPS BPLR FENES DVNC XI (FORCEP) ×1 IMPLANT
GLOVE BIOGEL PI IND STRL 7.0 (GLOVE) ×2 IMPLANT
GLOVE SURG SYN 6.5 PF PI (GLOVE) ×4 IMPLANT
GOWN STRL REUS W/ TWL LRG LVL3 (GOWN DISPOSABLE) ×4 IMPLANT
GRASPER SUT TROCAR 14GX15 (MISCELLANEOUS) IMPLANT
KIT TURNOVER KIT A (KITS) ×1 IMPLANT
MANIFOLD NEPTUNE II (INSTRUMENTS) ×1 IMPLANT
NDL HYPO 22X1.5 SAFETY MO (MISCELLANEOUS) ×1 IMPLANT
NDL INSUFFLATION 14GA 120MM (NEEDLE) ×1 IMPLANT
NEEDLE HYPO 22X1.5 SAFETY MO (MISCELLANEOUS) ×1 IMPLANT
NEEDLE INSUFFLATION 14GA 120MM (NEEDLE) ×1 IMPLANT
OBTURATOR OPTICALSTD 8 DVNC (TROCAR) ×1 IMPLANT
PACK LAP CHOLECYSTECTOMY (MISCELLANEOUS) ×1 IMPLANT
RELOAD STAPLE 45 2.5 WHT DVNC (STAPLE) IMPLANT
RELOAD STAPLE 45 3.5 BLU DVNC (STAPLE) IMPLANT
SCISSORS MNPLR CVD DVNC XI (INSTRUMENTS) ×1 IMPLANT
SEAL UNIV 5-12 XI (MISCELLANEOUS) ×3 IMPLANT
SET TUBE SMOKE EVAC HIGH FLOW (TUBING) ×1 IMPLANT
SOLN STERILE WATER 500 ML (IV SOLUTION) ×1 IMPLANT
SOLUTION ELECTROSURG ANTI STCK (MISCELLANEOUS) ×1 IMPLANT
STAPLER 45 SUREFORM DVNC (STAPLE) IMPLANT
SUT MNCRL AB 4-0 PS2 18 (SUTURE) ×1 IMPLANT
SUT VICRYL 0 UR6 27IN ABS (SUTURE) ×1 IMPLANT
SYR 30ML LL (SYRINGE) ×1 IMPLANT
TRAP FLUID SMOKE EVACUATOR (MISCELLANEOUS) ×1 IMPLANT

## 2024-08-02 NOTE — Transfer of Care (Signed)
 Immediate Anesthesia Transfer of Care Note  Patient: Jerry [name]  Procedure(s) Performed: APPENDECTOMY, ROBOT-ASSISTED, LAPAROSCOPIC  Patient Location: PACU  Anesthesia Type:General  Level of Consciousness: sedated  Airway & Oxygen Therapy: Patient Spontanous Breathing and Patient connected to face mask oxygen  Post-op Assessment: Report given to RN and Post -op Vital signs reviewed and stable  Post vital signs: Reviewed  Last Vitals:  Vitals Value Taken Time  BP 132/67   Temp    Pulse 61   Resp 20   SpO2 100     Last Pain:  Vitals:   08/02/24 0830  TempSrc:   PainSc: Asleep      Patients Stated Pain Goal: 0 (08/01/24 2116)  Complications: No notable events documented.

## 2024-08-02 NOTE — ED Notes (Signed)
 Fall risk bundle is currently in place.

## 2024-08-02 NOTE — Anesthesia Postprocedure Evaluation (Signed)
 Anesthesia Post Note  Patient: Jerry [name]  Procedure(s) Performed: APPENDECTOMY, ROBOT-ASSISTED, LAPAROSCOPIC  Patient location during evaluation: PACU Anesthesia Type: General Level of consciousness: awake and alert Pain management: pain level controlled Vital Signs Assessment: post-procedure vital signs reviewed and stable Respiratory status: spontaneous breathing, nonlabored ventilation, respiratory function stable and patient connected to nasal cannula oxygen Cardiovascular status: blood pressure returned to baseline and stable Postop Assessment: no apparent nausea or vomiting Anesthetic complications: no   No notable events documented.   Last Vitals:  Vitals:   08/02/24 0303 08/02/24 0639  BP: (!) 151/77 122/75  Pulse: 64 82  Resp: 18 17  Temp: 36.5 C 36.6 C  SpO2: 99% 98%    Last Pain:  Vitals:   08/02/24 0830  TempSrc:   PainSc: Asleep                 Debby Mines

## 2024-08-02 NOTE — Op Note (Signed)
 Preoperative diagnosis: acute appendicitis  Postoperative diagnosis: Same  Procedure: Robotic assisted laparoscopic appendectomy.  Anesthesia: GETA  Surgeon: Henriette Pierre  Wound Classification: clean contaminated  Specimen: Appendix  Complications: None  Estimated Blood Loss: 3 mL   Indications: Patient is a 63 y.o. male  presented with above.  Please see H&P for further details.    FIndings: 1.  Irritated appendix  2. No peri-appendiceal abscess or phlegmon 3. Normal anatomy 4. Appendiceal artery ligated and divided with stapler 5. Adequate hemostasis.   Description of procedure: The patient was placed on the operating table in the supine position, left arm tucked. General anesthesia was induced. A time-out was completed verifying correct patient, procedure, site, positioning, and implant(s) and/or special equipment prior to beginning this procedure. The abdomen was prepped and draped in the usual sterile fashion.   Palmer's point located and Veress needle was inserted.  After confirming 2 clicks and a positive saline drop test, gas insufflation was initiated until the abdominal pressure was measured at 15 mmHg.  Afterwards, the Veress needle was removed and a 8 mm port was placed through a periumbilical site using Optiview technique after incision with an 11 blade.  After local was infused, 2 additional incision was made 8 cm apart each side along the left side of the abdominal wall from the initial incision.  An 8 mm port caudad and 12mm port cephalad from initial incision, both under direct visualization.  No injuries from trocar placements were noted. The table was placed in the Trendelenburg position with the right side elevated.  Xi robotic platform was then brought to the operative field and docked.  An inflamed appendix was identified and elevated.  Infection was present within the abdominal cavity due to appendicitis. Window created at base of appendix in the mesentery.   A  blue load linear cutting stapler was then used to divide and staple the base of the appendix. It was reloaded with a vascular cartridge and the mesoappendix similarly divided.  No bleeding from the staple lines noted.  The appendix was placed in an endoscopic retrieval bag and removed.   The appendiceal stump and mesoappendix staple line examined again and hemostasis noted. No other pathology was identified within pelvis. The 12 mm trocar removed and port site closed with PMI using 0 vicryl under direct vision. Remaining trocars were removed under direct vision. No bleeding was noted.The abdomen was allowed to collapse. All skin incisions then closed with subcuticular sutures Monocryl 4-0.  Wounds then dressed with dermabond.  The patient tolerated the procedure well, awakened from anesthesia and was taken to the postanesthesia care unit in satisfactory condition.  Sponge count and instrument count correct at the end of the procedure.

## 2024-08-02 NOTE — Anesthesia Procedure Notes (Signed)
 Procedure Name: Intubation Date/Time: 08/02/2024 11:15 AM  Performed by: Leontine Katz, CRNAPre-anesthesia Checklist: Patient identified, Patient being monitored, Timeout performed, Emergency Drugs available and Suction available Patient Re-evaluated:Patient Re-evaluated prior to induction Oxygen Delivery Method: Circle system utilized Preoxygenation: Pre-oxygenation with 100% oxygen Induction Type: IV induction Ventilation: Mask ventilation without difficulty Laryngoscope Size: McGrath and 4 Grade View: Grade I Tube type: Oral Tube size: 7.5 mm Number of attempts: 1 Airway Equipment and Method: Stylet and Video-laryngoscopy Placement Confirmation: ETT inserted through vocal cords under direct vision, positive ETCO2 and breath sounds checked- equal and bilateral Secured at: 23 cm Tube secured with: Tape Dental Injury: Teeth and Oropharynx as per pre-operative assessment

## 2024-08-02 NOTE — Anesthesia Preprocedure Evaluation (Signed)
 Anesthesia Evaluation  Patient identified by MRN, date of birth, ID band Patient awake    Reviewed: Allergy & Precautions, NPO status , Patient's Chart, lab work & pertinent test results  Airway Mallampati: III  TM Distance: >3 FB Neck ROM: full    Dental  (+) Dental Advidsory Given, Edentulous Upper, Edentulous Lower   Pulmonary neg pulmonary ROS   Pulmonary exam normal        Cardiovascular Normal cardiovascular exam+ dysrhythmias      Neuro/Psych  PSYCHIATRIC DISORDERS      negative neurological ROS     GI/Hepatic negative GI ROS, Neg liver ROS,,,  Endo/Other  negative endocrine ROS    Renal/GU      Musculoskeletal   Abdominal   Peds  Hematology negative hematology ROS (+)   Anesthesia Other Findings Patient has a holter monitor on due to some fainting spells the past few weeks. Patient states he had been passing out due to the abdominal pain and thinks his appendix was the cause. Denies any chest pain, shortness of breath, vomiting today.   Past Medical History: No date: Kidney stones No date: Substance abuse (HCC)  Past Surgical History: No date: HERNIA REPAIR No date: NECK SURGERY     Reproductive/Obstetrics negative OB ROS                              Anesthesia Physical Anesthesia Plan  ASA: 2  Anesthesia Plan: General ETT   Post-op Pain Management:    Induction: Intravenous  PONV Risk Score and Plan: 2 and Ondansetron , Dexamethasone , Midazolam  and Treatment may vary due to age or medical condition  Airway Management Planned: Oral ETT  Additional Equipment:   Intra-op Plan:   Post-operative Plan: Extubation in OR  Informed Consent: I have reviewed the patients History and Physical, chart, labs and discussed the procedure including the risks, benefits and alternatives for the proposed anesthesia with the patient or authorized representative who has indicated  his/her understanding and acceptance.     Dental Advisory Given  Plan Discussed with: Anesthesiologist, CRNA and Surgeon  Anesthesia Plan Comments: (Patient consented for risks of anesthesia including but not limited to:  - adverse reactions to medications - damage to eyes, teeth, lips or other oral mucosa - nerve damage due to positioning  - sore throat or hoarseness - Damage to heart, brain, nerves, lungs, other parts of body or loss of life  Patient voiced understanding and assent.)        Anesthesia Quick Evaluation

## 2024-08-02 NOTE — Interval H&P Note (Signed)
 No change. OK to proceed.

## 2024-08-02 NOTE — Discharge Instructions (Addendum)
 Laparoscopic Appendectomy, Care After This sheet gives you information about how to care for yourself after your procedure. Your doctor may also give you more specific instructions. If you have problems or questions, contact your doctor. Follow these instructions at home: Care for cuts from surgery (incisions)  Follow instructions from your doctor about how to take care of your cuts from surgery. Make sure you: Wash your hands with soap and water before you change your bandage (dressing). If you cannot use soap and water, use hand sanitizer. Change your bandage as told by your doctor. Leave stitches (sutures), skin glue, or skin tape (adhesive) strips in place. They may need to stay in place for 2 weeks or longer. If tape strips get loose and curl up, you may trim the loose edges. Do not remove tape strips completely unless your doctor says it is okay. Do not take baths, swim, or use a hot tub until your doctor says it is okay. OK TO SHOWER 24HRS AFTER YOUR SURGERY.  Check your surgical cut area every day for signs of infection. Check for: More redness, swelling, or pain. More fluid or blood. Warmth. Pus or a bad smell. Activity Do not drive or use heavy machinery while taking prescription pain medicine. Do not play contact sports until your doctor says it is okay. Do not drive for 24 hours if you were given a medicine to help you relax (sedative). Rest as needed. Do not return to work or school until your doctor says it is okay. General instructions  tylenol  as needed for discomfort.  Please Celebrex  and Neurontin  as prescribed regardless of pain level  Use narcotics, if prescribed, only when above medications is not enough to control pain.  325-650mg  every 8hrs to max of 3000mg /24hrs (including the 325mg  in every norco dose) for the tylenol .   To prevent or treat constipation while you are taking prescription pain medicine, your doctor may recommend that you: Drink enough fluid to keep  your pee (urine) clear or pale yellow. Take over-the-counter or prescription medicines. Eat foods that are high in fiber, such as fresh fruits and vegetables, whole grains, and beans. Limit foods that are high in fat and processed sugars, such as fried and sweet foods. Contact a doctor if: You develop a rash. You have more redness, swelling, or pain around your surgical cuts. You have more fluid or blood coming from your surgical cuts. Your surgical cuts feel warm to the touch. You have pus or a bad smell coming from your surgical cuts. You have a fever. One or more of your surgical cuts breaks open. Get help right away if: You have trouble breathing. You have chest pain. You faint or feel dizzy when you stand. You have leg pain. This information is not intended to replace advice given to you by your health care provider. Make sure you discuss any questions you have with your health care provider. Document Released: 06/05/2008 Document Revised: 03/17/2016 Document Reviewed: 02/13/2016 Elsevier Interactive Patient Education  2019 [employer].

## 2024-08-03 ENCOUNTER — Encounter: Payer: Self-pay | Admitting: Surgery

## 2024-08-04 LAB — SURGICAL PATHOLOGY

## 2024-08-12 ENCOUNTER — Encounter: Payer: Self-pay | Admitting: Surgery
